# Patient Record
Sex: Male | Born: 1995 | Race: White | Hispanic: No | Marital: Single | Smoking: Current every day smoker
Health system: Southern US, Community
[De-identification: ages and names within clinical notes are randomized; demographics above are authoritative.]

---
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
## 2015-06-22 ENCOUNTER — Encounter (HOSPITAL_COMMUNITY): Payer: Self-pay | Admitting: Emergency Medicine

## 2015-06-22 ENCOUNTER — Emergency Department (HOSPITAL_COMMUNITY)
Admission: EM | Admit: 2015-06-22 | Discharge: 2015-06-22 | Disposition: A | Discharge status: Home / Self Care | Payer: Self-pay | Attending: Emergency Medicine | Admitting: Emergency Medicine

## 2015-06-22 DIAGNOSIS — Z720 Tobacco use: Secondary | ICD-10-CM | POA: Insufficient documentation

## 2015-06-22 DIAGNOSIS — R21 Rash and other nonspecific skin eruption: Secondary | ICD-10-CM | POA: Insufficient documentation

## 2015-06-22 DIAGNOSIS — R2231 Localized swelling, mass and lump, right upper limb: Secondary | ICD-10-CM | POA: Insufficient documentation

## 2015-06-22 DIAGNOSIS — M7989 Other specified soft tissue disorders: Secondary | ICD-10-CM

## 2015-06-22 MED ORDER — CEPHALEXIN 500 MG PO CAPS: 500.0000 mg | ORAL_CAPSULE | Freq: Four times a day (QID) | ORAL | Status: AC

## 2015-06-22 MED ORDER — IBUPROFEN 800 MG PO TABS: 800.0000 mg | ORAL_TABLET | Freq: Three times a day (TID) | ORAL | Status: AC

## 2015-06-22 NOTE — ED Notes (Signed)
Pt reports to ER from home c/o rt arm swelling and rash to rt antecubital area and forearm; Pt reports getting a new tattoo on the upper right arm 2 days ago; Pt states swelling began about 3 hours ago; pt denies pain but states he feels pressure on his arm. NAD noted; pt alert and oriented and ambulatory

## 2015-06-22 NOTE — ED Provider Notes (Signed)
CSN: [id number] date & time 06/22/15  0045 History   First MD Initiated Contact with Patient 06/22/15 0059     Chief Complaint  Patient presents with  . Arm Swelling     (Consider location/radiation/quality/duration/timing/severity/associated sxs/prior Treatment) HPI Comments: The patient presents with concern for rash and localized swelling of the right UE. He had an extensive tattoo placed to the medial upper arm that took 5 hours to complete 2 days ago. Tonight he noticed a rash to medial elbow with swelling of the rash area where swelling extended to proximal forearm. No fever. The rash and swollen area is not tender.   Patient is a 19 y.o. male presenting with rash. The history is provided by the patient. No language interpreter was used.  Rash Associated symptoms: no fever, no nausea and no shortness of breath     History reviewed. No pertinent past medical history. History reviewed. No pertinent past surgical history. No family history on file. Social History  Substance Use Topics  . Smoking status: Current Every Day Smoker -- 0.25 packs/day    Types: Cigarettes  . Smokeless tobacco: None  . Alcohol Use: Yes     Comment: occasional    Review of Systems  Constitutional: Negative for fever.  Respiratory: Negative for shortness of breath.   Cardiovascular: Negative for chest pain.  Gastrointestinal: Negative for nausea.  Musculoskeletal:       See HPI.  Skin: Positive for rash.      Allergies  Review of patient's allergies indicates not on file.  Home Medications   Prior to Admission medications   Not on File   BP 154/91 mmHg  Pulse 67  Temp(Src) 98.8 F (37.1 C) (Oral)  Resp 16  SpO2 100% Physical Exam  Constitutional: He is oriented to person, place, and time. He appears well-developed and well-nourished.  Neck: Normal range of motion.  Pulmonary/Chest: Effort normal.  Musculoskeletal: Normal range of motion.  Right upper extremity has new  tattoo on entire medial aspect (completion of 'sleeve' tattoo) of upper arm. There is a petechial rash distally on medial elbow and mild swelling without redness or warmth to proximal forearm, also medially. Minimal tenderness. FROM UE.   Neurological: He is alert and oriented to person, place, and time.  Skin: Skin is warm and dry.  Psychiatric: He has a normal mood and affect.    ED Course  Procedures (including critical care time) Labs Review Labs Reviewed - No data to display  Imaging Review No results found. I have personally reviewed and evaluated these images and lab results as part of my medical decision-making.   EKG Interpretation None      MDM   Final diagnoses:  None    1. Upper extremity inflammation  No evidence infection - no cellulitic appearing changes, no warmth. Rash and swelling c/w inflammatory changes after prolonged trauma of tattooing. Will provide prophylactic antibiotics, and stress importance of return to ED with any worsening symptoms or new changes.    Elpidio Anis, PA-C 06/22/15 0129  April Palumbo, MD 06/22/15 [phone]

## 2015-06-22 NOTE — Discharge Instructions (Signed)
Heat Therapy  Heat therapy can help ease sore, stiff, injured, and tight muscles and joints. Heat relaxes your muscles, which may help ease your pain.   RISKS AND COMPLICATIONS  If you have any of the following conditions, do not use heat therapy unless your health care provider has approved:   Poor circulation.   Healing wounds or scarred skin in the area being treated.   Diabetes, heart disease, or high blood pressure.   Not being able to feel (numbness) the area being treated.   Unusual swelling of the area being treated.   Active infections.   Blood clots.   Cancer.   Inability to communicate pain. This may include young children and people who have problems with their brain function (dementia).   Pregnancy.  Heat therapy should only be used on old, pre-existing, or long-lasting (chronic) injuries. Do not use heat therapy on new injuries unless directed by your health care provider.  HOW TO USE HEAT THERAPY  There are several different kinds of heat therapy, including:   Moist heat pack.   Warm water bath.   Hot water bottle.   Electric heating pad.   Heated gel pack.   Heated wrap.   Electric heating pad.  Use the heat therapy method suggested by your health care provider. Follow your health care provider's instructions on when and how to use heat therapy.  GENERAL HEAT THERAPY RECOMMENDATIONS   Do not sleep while using heat therapy. Only use heat therapy while you are awake.   Your skin may turn pink while using heat therapy. Do not use heat therapy if your skin turns red.   Do not use heat therapy if you have new pain.   High heat or long exposure to heat can cause burns. Be careful when using heat therapy to avoid burning your skin.   Do not use heat therapy on areas of your skin that are already irritated, such as with a rash or sunburn.  SEEK MEDICAL CARE IF:   You have blisters, redness, swelling, or numbness.   You have new pain.   Your pain is worse.  MAKE SURE  YOU:   Understand these instructions.   Will watch your condition.   Will get help right away if you are not doing well or get worse.     This information is not intended to replace advice given to you by your health care provider. Make sure you discuss any questions you have with your health care provider.     Document Released: 11/23/2011 Document Revised: 09/21/2014 Document Reviewed: 10/24/2013  [employer]vier Interactive Patient Education 2016 [employer]

## 2016-07-05 ENCOUNTER — Ambulatory Visit (HOSPITAL_COMMUNITY)
Admission: EM | Admit: 2016-07-05 | Discharge: 2016-07-05 | Disposition: A | Discharge status: Home / Self Care | Payer: BLUE CROSS/BLUE SHIELD | Attending: Family Medicine | Admitting: Family Medicine

## 2016-07-05 ENCOUNTER — Encounter (HOSPITAL_COMMUNITY): Payer: Self-pay | Admitting: Emergency Medicine

## 2016-07-05 DIAGNOSIS — L290 Pruritus ani: Secondary | ICD-10-CM | POA: Diagnosis not present

## 2016-07-05 DIAGNOSIS — F17210 Nicotine dependence, cigarettes, uncomplicated: Secondary | ICD-10-CM | POA: Insufficient documentation

## 2016-07-05 NOTE — ED Triage Notes (Signed)
[facility] a complaint of rectal itching and discolored bowel movements x 1 week. The patient denied pain but expressed some concern for a possible parasite infection.

## 2016-07-05 NOTE — Discharge Instructions (Signed)
Bring back the stool sample within the next couple of hours for us to do the ova and parasite exam.    You need to get established with a primary care physician.

## 2016-07-05 NOTE — ED Provider Notes (Signed)
[facility]    CSN: [id number] Arrival date & time: 07/05/16  1257     History   Chief Complaint Chief Complaint  Patient presents with  . Anal Itching    HPI HESTER FORGET is a 20 y.o. male.   HPI Patient comes in today with the above complaint.  States that he has been having anal pruritus and white pieces of "rice" looking things in his stool x 1 week.  Denies recent travel, fever, chills, chest pain, sob, abd pain, constipation, diarrhea, nausea/vomiting, melena, hematochezia, dysuria, hematuria.   History reviewed. No pertinent past medical history.  There are no active problems to display for this patient.   History reviewed. No pertinent surgical history.     Home Medications    Prior to Admission medications   Medication Sig Start Date End Date Taking? Authorizing Provider  cephALEXin (KEFLEX) 500 MG capsule Take 1 capsule (500 mg total) by mouth 4 (four) times daily. 06/22/15   Elpidio Anis, PA-C  ibuprofen (ADVIL,MOTRIN) 800 MG tablet Take 1 tablet (800 mg total) by mouth 3 (three) times daily. 06/22/15   Elpidio Anis, PA-C    Family History History reviewed. No pertinent family history.  Social History Social History  Substance Use Topics  . Smoking status: Current Every Day Smoker    Packs/day: 0.25    Years: 5.00    Types: Cigarettes  . Smokeless tobacco: Never Used  . Alcohol use Yes     Comment: occasional     Allergies   Review of patient's allergies indicates no known allergies.   Review of Systems Review of Systems  Constitutional: Negative.   HENT: Negative.   Respiratory: Negative.   Gastrointestinal: Negative for abdominal pain, anal bleeding, blood in stool, constipation, diarrhea, nausea, rectal pain and vomiting.  Genitourinary: Negative.   Musculoskeletal: Negative.   Skin: Negative.   Neurological: Negative.   Psychiatric/Behavioral: Negative.      Physical Exam Triage Vital Signs ED Triage Vitals    Enc Vitals Group     BP 07/05/16 1419 151/83     Pulse Rate 07/05/16 1419 62     Resp 07/05/16 1419 16     Temp 07/05/16 1419 98.3 F (36.8 C)     Temp Source 07/05/16 1419 Oral     SpO2 07/05/16 1419 100 %     Weight --      Height --      Head Circumference --      Peak Flow --      Pain Score 07/05/16 1420 0     Pain Loc --      Pain Edu? --      Excl. in GC? --    No data found.   Updated Vital Signs BP 151/83 (BP Location: Left Arm)   Pulse 62   Temp 98.3 F (36.8 C) (Oral)   Resp 16   SpO2 100%   Visual Acuity Right Eye Distance:   Left Eye Distance:   Bilateral Distance:    Right Eye Near:   Left Eye Near:    Bilateral Near:     Physical Exam  Constitutional: He is oriented to person, place, and time. He appears well-developed. No distress.  HENT:  Head: Normocephalic and atraumatic.  Eyes: EOM are normal. Pupils are equal, round, and reactive to light.  Neck: Normal range of motion.  Cardiovascular: Normal rate.   Pulmonary/Chest: Effort normal. No respiratory distress. He has no wheezes.  Abdominal: Soft.  He exhibits no distension and no mass. There is no tenderness. There is no guarding.  Genitourinary:  Genitourinary Comments: No perianal lesion, redness, discharge.   Musculoskeletal: Normal range of motion.  Neurological: He is alert and oriented to person, place, and time.  Skin: Skin is warm.     UC Treatments / Results  Labs (all labs ordered are listed, but only abnormal results are displayed) Labs Reviewed  OVA + PARASITE EXAM    EKG  EKG Interpretation None       Radiology No results found.  Procedures Procedures (including critical care time)  Medications Ordered in UC Medications - No data to display   Initial Impression / Assessment and Plan / UC Course  I have reviewed the triage vital signs and the nursing notes.  Pertinent labs & imaging results that were available during my care of the patient were reviewed by  me and considered in my medical decision making (see chart for details).  Clinical Course      Final Clinical Impressions(s) / UC Diagnoses   Final diagnoses:  Anal itching    New Prescriptions New Prescriptions   No medications on file  patient was given a specimen cup and will bring back a stool sample within the next couple of hours and we will send this for ova and parasites.  All questions answered.  Needs to get established with a primary care physician.    Naida SleightJames M Taylin Mans, PA-C 07/05/16 1517

## 2016-07-05 NOTE — ED Notes (Signed)
Patient provided a bed pan, 2 pairs of gloves, sterile collection cup, and 2 sterile wooden tongue depressors along with collection instructions.

## 2016-07-07 ENCOUNTER — Telehealth (HOSPITAL_COMMUNITY): Payer: Self-pay | Admitting: Emergency Medicine

## 2016-07-07 NOTE — Telephone Encounter (Signed)
Returned pt's call... Needing lab results from recent visit on 10/22 Notified him that labs are still pending and adv him to sign up for MyChart so he can view his lab results  Notified pt we will only call if labs come back abnormal Pt verb understanding.

## 2016-07-08 LAB — O&P RESULT

## 2016-07-08 LAB — OVA + PARASITE EXAM
